# Patient Record
Sex: Female | Born: 1995 | Race: White | Hispanic: No | Smoking: Never smoker
Health system: Southern US, Community
[De-identification: ages and names within clinical notes are randomized; demographics above are authoritative.]

---
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
## 2015-11-26 ENCOUNTER — Emergency Department (HOSPITAL_COMMUNITY)
Admission: EM | Admit: 2015-11-26 | Discharge: 2015-11-26 | Disposition: A | Discharge status: Home / Self Care | Payer: BLUE CROSS/BLUE SHIELD | Attending: Emergency Medicine | Admitting: Emergency Medicine

## 2015-11-26 ENCOUNTER — Emergency Department (HOSPITAL_COMMUNITY): Payer: BLUE CROSS/BLUE SHIELD

## 2015-11-26 ENCOUNTER — Encounter (HOSPITAL_COMMUNITY): Payer: Self-pay

## 2015-11-26 DIAGNOSIS — N12 Tubulo-interstitial nephritis, not specified as acute or chronic: Secondary | ICD-10-CM | POA: Insufficient documentation

## 2015-11-26 DIAGNOSIS — R109 Unspecified abdominal pain: Secondary | ICD-10-CM | POA: Diagnosis present

## 2015-11-26 LAB — COMPREHENSIVE METABOLIC PANEL
ALBUMIN: 3.9 g/dL (ref 3.5–5.0)
ALT: 10 U/L — ABNORMAL LOW (ref 14–54)
ANION GAP: 6 (ref 5–15)
AST: 14 U/L — ABNORMAL LOW (ref 15–41)
Alkaline Phosphatase: 57 U/L (ref 38–126)
BILIRUBIN TOTAL: 1 mg/dL (ref 0.3–1.2)
BUN: 6 mg/dL (ref 6–20)
CALCIUM: 9 mg/dL (ref 8.9–10.3)
CO2: 25 mmol/L (ref 22–32)
Chloride: 106 mmol/L (ref 101–111)
Creatinine, Ser: 0.71 mg/dL (ref 0.44–1.00)
GFR calc non Af Amer: >60 mL/min (ref >60)
GLUCOSE: 113 mg/dL — AB (ref 65–99)
POTASSIUM: 3.7 mmol/L (ref 3.5–5.1)
SODIUM: 137 mmol/L (ref 135–145)
TOTAL PROTEIN: 7.1 g/dL (ref 6.5–8.1)

## 2015-11-26 LAB — URINALYSIS, ROUTINE W REFLEX MICROSCOPIC
BILIRUBIN URINE: NEGATIVE
Glucose, UA: NEGATIVE mg/dL
Ketones, ur: 15 mg/dL — AB
NITRITE: POSITIVE — AB
PH: 6 (ref 5.0–8.0)
Protein, ur: 30 mg/dL — AB
SPECIFIC GRAVITY, URINE: 1.024 (ref 1.005–1.030)

## 2015-11-26 LAB — CBC
HEMATOCRIT: 40.9 % (ref 36.0–46.0)
HEMOGLOBIN: 13.3 g/dL (ref 12.0–15.0)
MCH: 32.2 pg (ref 26.0–34.0)
MCHC: 32.5 g/dL (ref 30.0–36.0)
MCV: 99 fL (ref 78.0–100.0)
Platelets: 196 10*3/uL (ref 150–400)
RBC: 4.13 MIL/uL (ref 3.87–5.11)
RDW: 13.4 % (ref 11.5–15.5)
WBC: 10.9 10*3/uL — ABNORMAL HIGH (ref 4.0–10.5)

## 2015-11-26 LAB — URINE MICROSCOPIC-ADD ON

## 2015-11-26 LAB — I-STAT BETA HCG BLOOD, ED (MC, WL, AP ONLY)

## 2015-11-26 LAB — LIPASE, BLOOD: LIPASE: 28 U/L (ref 11–51)

## 2015-11-26 MED ORDER — KETOROLAC TROMETHAMINE 30 MG/ML IJ SOLN
30.0000 mg | Freq: Once | INTRAMUSCULAR | Status: AC
  Administered 2015-11-26: 30 mg via INTRAVENOUS
  Filled 2015-11-26: qty 1

## 2015-11-26 MED ORDER — CEPHALEXIN 500 MG PO CAPS: 500.0000 mg | ORAL_CAPSULE | Freq: Four times a day (QID) | ORAL | 0 refills | Status: AC

## 2015-11-26 MED ORDER — CEPHALEXIN 250 MG PO CAPS
500.0000 mg | ORAL_CAPSULE | Freq: Once | ORAL | Status: AC
  Administered 2015-11-26: 500 mg via ORAL
  Filled 2015-11-26: qty 2

## 2015-11-26 MED ORDER — SODIUM CHLORIDE 0.9 % IV BOLUS (SEPSIS)
1000.0000 mL | Freq: Once | INTRAVENOUS | Status: AC
  Administered 2015-11-26: 1000 mL via INTRAVENOUS

## 2015-11-26 NOTE — Discharge Instructions (Signed)
Take 4 over the counter ibuprofen tablets 3 times a day or 2 over-the-counter naproxen tablets twice a day for pain. Also take tylenol 1000mg(2 extra strength) four times a day.    

## 2015-11-26 NOTE — ED Notes (Signed)
Pt departed in NAD, refused use of wheelchair.  

## 2015-11-26 NOTE — ED Triage Notes (Signed)
Onset 11-25-15 @ 4am pt was awakened with chills, sweating and right lateral abd pain, mild cough, and nasal congestion.   Pt has been in bed since then.  No urinary complaints.

## 2015-11-26 NOTE — ED Triage Notes (Signed)
Pt took Ibuprofen 600 mg @ 6pm.
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
## 2015-11-26 NOTE — ED Notes (Signed)
Patient transported to CT 

## 2015-11-26 NOTE — ED Notes (Signed)
Informed MD as to pt's low BP. Pt has hx of baseline hypotension, thus we will continue to record and observe.

## 2017-10-06 IMAGING — CT CT RENAL STONE PROTOCOL
2 of 4 series · 15 of 46 positions shown, 17 images · non-contrast
Comparison: None.

CLINICAL DATA: Right flank pain. Mid abdominal pain with nausea for
2 days.

EXAM:
CT ABDOMEN AND PELVIS WITHOUT CONTRAST
TECHNIQUE: Multidetector CT imaging of the abdomen and pelvis was performed
following the standard protocol without IV contrast.

[Series 2: renal stone 5mm · axial · 0.64mm/px · z∈[-815,-345]mm · 12 of 104 slices shown, 14 images]
[im 5/104  soft-tissue]
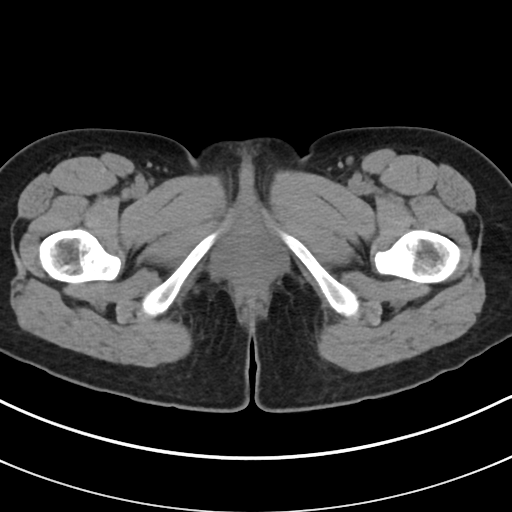
[im 5/104  bone]
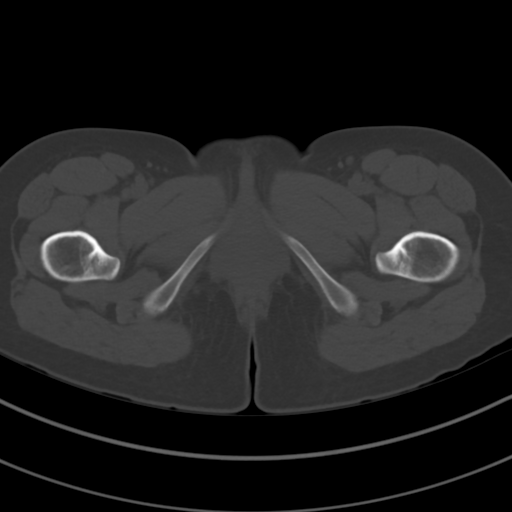
[im 13/104  soft-tissue]
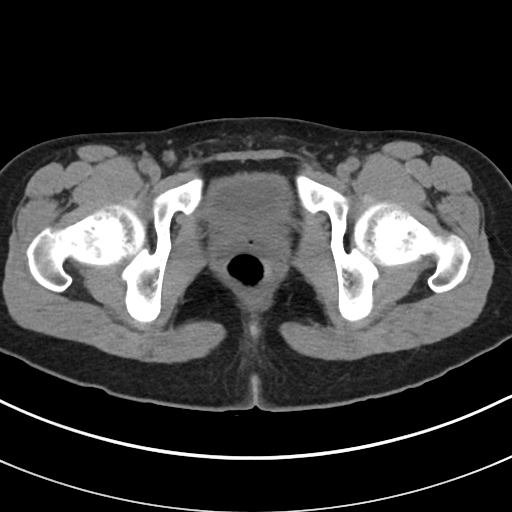
[im 22/104  soft-tissue]
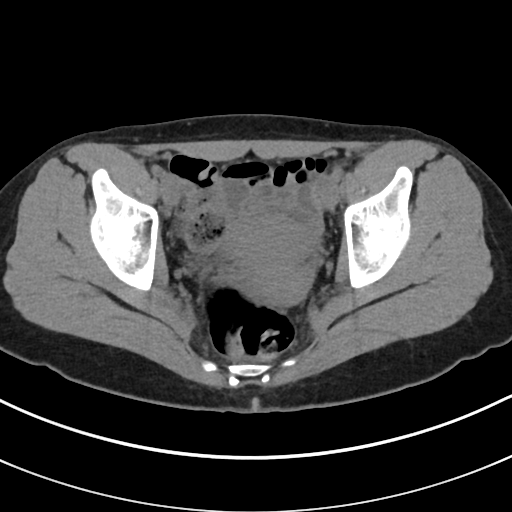
[im 31/104  soft-tissue]
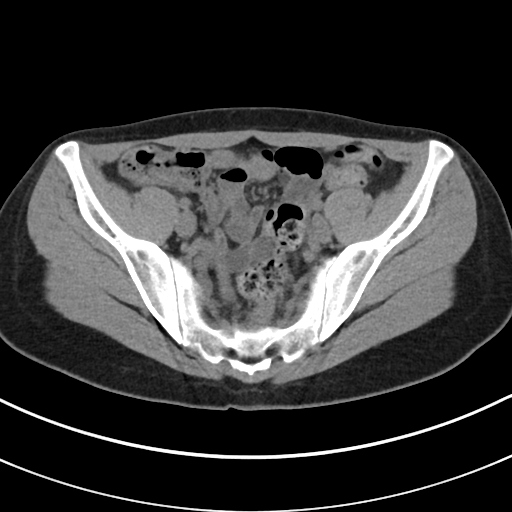
[im 39/104  soft-tissue]
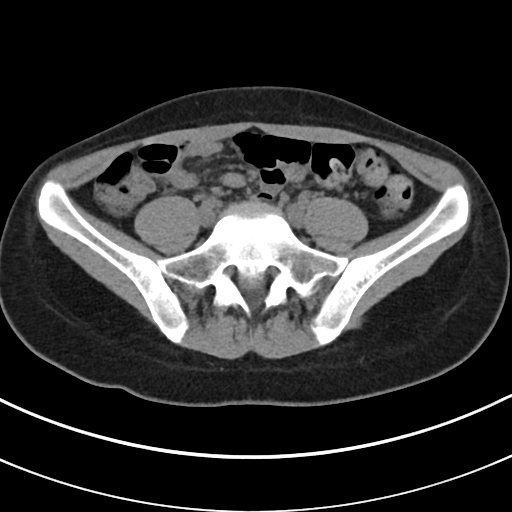
[im 48/104  soft-tissue]
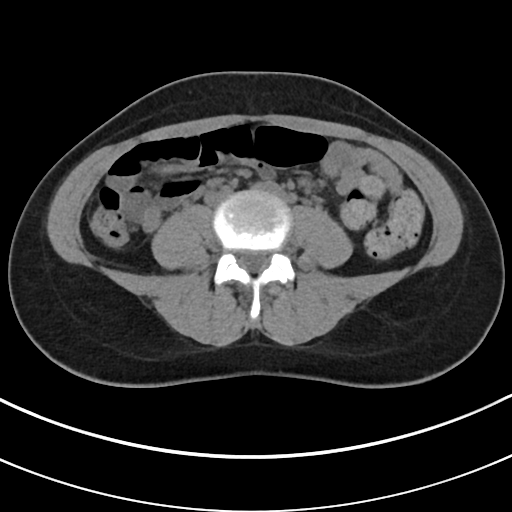
[im 56/104  soft-tissue]
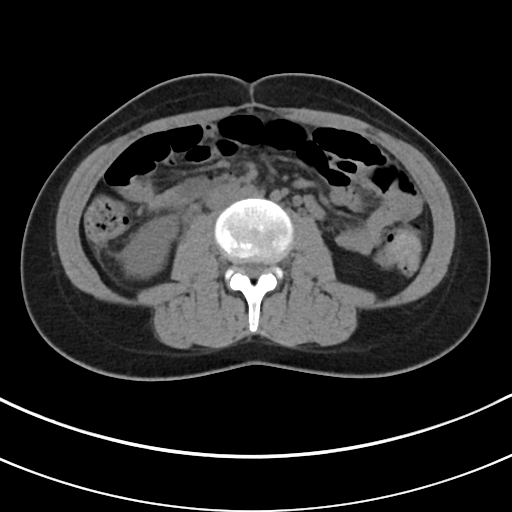
[im 65/104  soft-tissue]
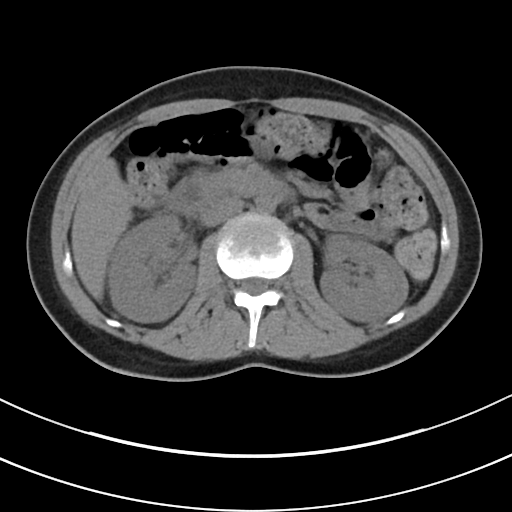
[im 73/104  soft-tissue]
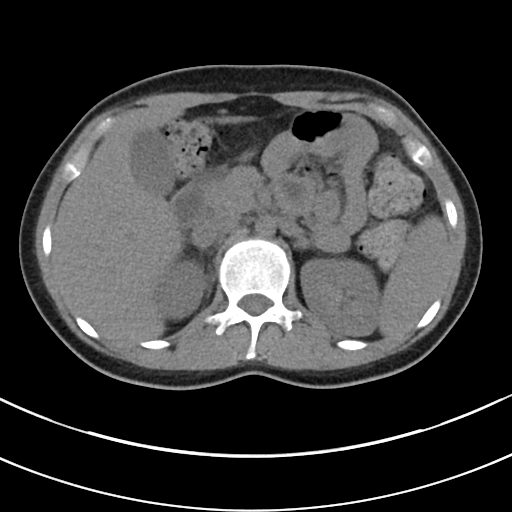
[im 73/104  bone]
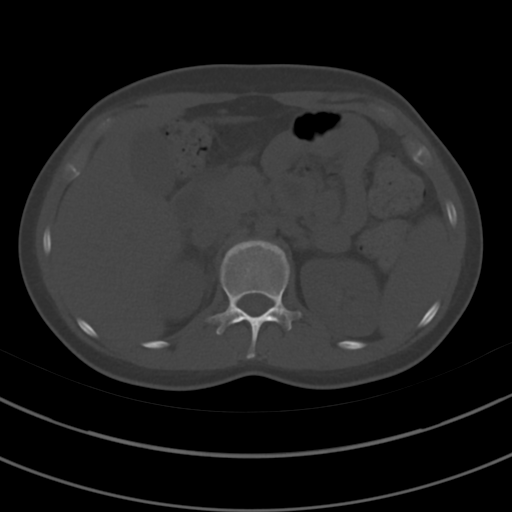
[im 82/104  soft-tissue]
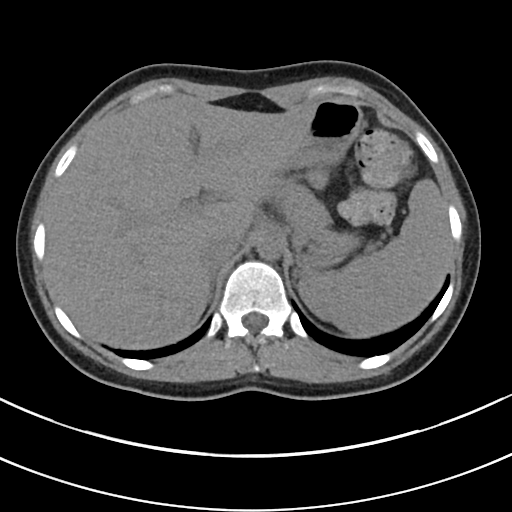
[im 91/104  soft-tissue]
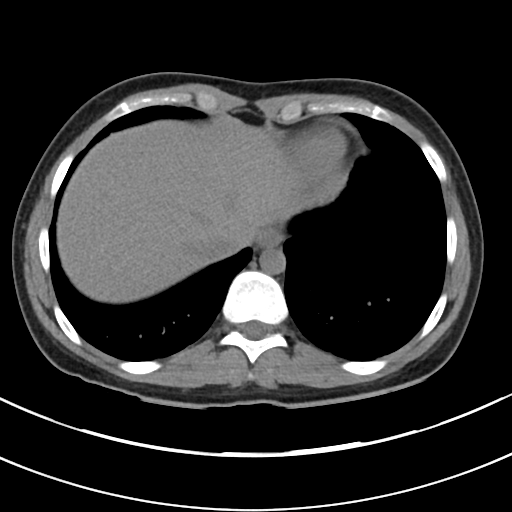
[im 99/104  soft-tissue]
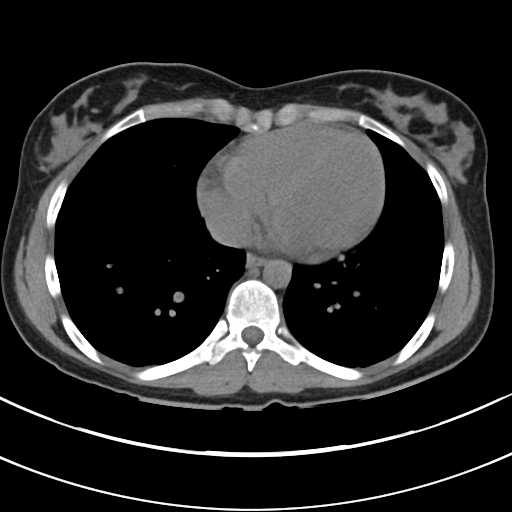

[Series 4: renal stone 3.0 cor · coronal · 0.61mm/px · 3 of 71 slices shown]
[im 24/71  soft-tissue]
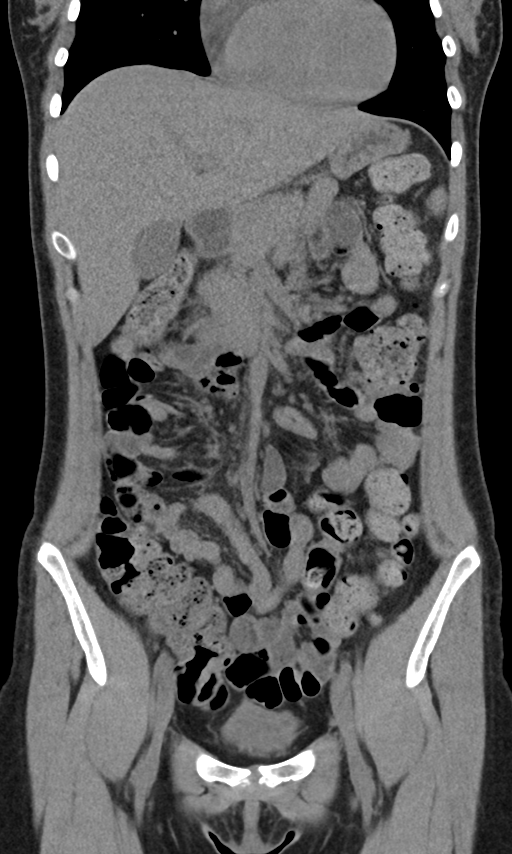
[im 32/71  soft-tissue]
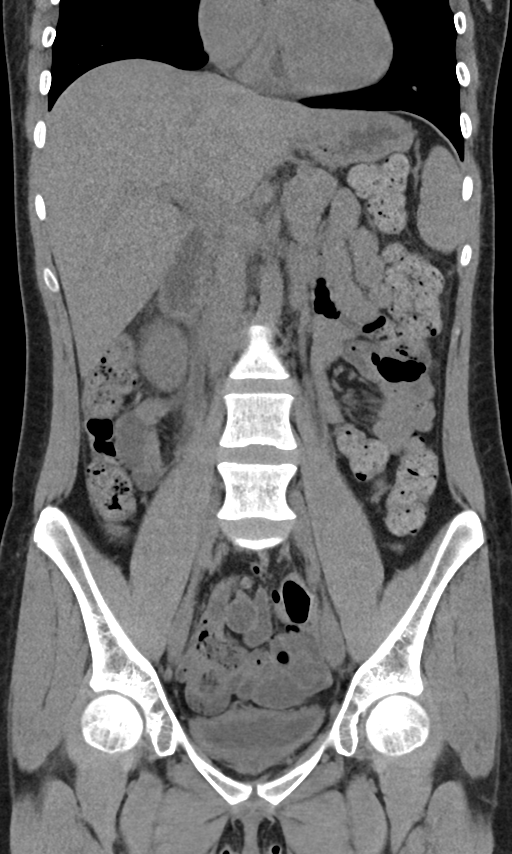
[im 39/71  soft-tissue]
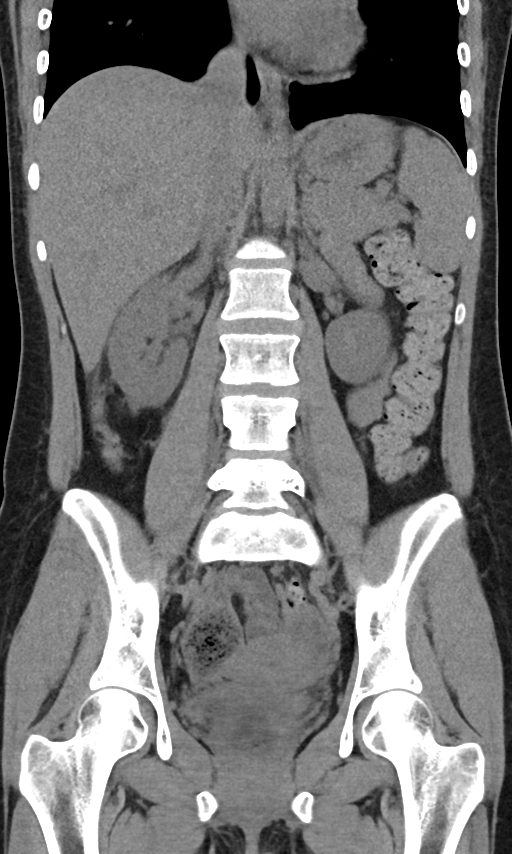

[15 of 46 positions shown; findings below may reference images not displayed]

FINDINGS: Lower chest: Mild dependent changes in the lung bases.

Hepatobiliary: Unenhanced appearance is unremarkable.

Pancreas: Unenhanced appearance is unremarkable.

Spleen: Unenhanced appearance is unremarkable.

Adrenals/Urinary Tract: No adrenal gland nodules. Kidneys are
symmetrical in size. No hydronephrosis or hydroureter. No renal,
ureteral, or bladder stones. Bladder wall is not thickened.

Stomach/Bowel: Stomach, small bowel, and colon are decompressed.
Scattered stool throughout the colon. Appendix is normal.

Vascular/Lymphatic: Normal caliber abdominal aorta. No
retroperitoneal lymphadenopathy.

Reproductive: Uterus and bilateral adnexa are unremarkable.

Other: No abdominal wall hernia or abnormality. No abdominopelvic
ascites.

Musculoskeletal: No acute or significant osseous findings.
IMPRESSION: No renal or ureteral stone or obstruction. No acute process
demonstrated in the abdomen or pelvis on noncontrast imaging.
